# Patient Record
Sex: Male | Born: 2000
Health system: Southern US, Community
[De-identification: ages and names within clinical notes are randomized; demographics above are authoritative.]

## PROBLEM LIST (undated history)

## (undated) HISTORY — PX: MANDIBLE SURGERY: SHX707

---
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
## 2019-06-10 DIAGNOSIS — Q539 Undescended testicle, unspecified: Secondary | ICD-10-CM | POA: Diagnosis not present

## 2019-06-10 DIAGNOSIS — N50819 Testicular pain, unspecified: Secondary | ICD-10-CM | POA: Diagnosis not present

## 2019-06-10 DIAGNOSIS — I861 Scrotal varices: Secondary | ICD-10-CM | POA: Diagnosis not present

## 2019-06-10 DIAGNOSIS — N5089 Other specified disorders of the male genital organs: Secondary | ICD-10-CM | POA: Diagnosis not present

## 2019-08-17 ENCOUNTER — Other Ambulatory Visit: Payer: BLUE CROSS/BLUE SHIELD

## 2019-08-18 ENCOUNTER — Ambulatory Visit: Payer: BC Managed Care – PPO | Attending: Internal Medicine

## 2019-08-18 DIAGNOSIS — Z20822 Contact with and (suspected) exposure to covid-19: Secondary | ICD-10-CM

## 2019-08-19 LAB — NOVEL CORONAVIRUS, NAA: SARS-CoV-2, NAA: NOT DETECTED

## 2019-09-08 ENCOUNTER — Ambulatory Visit: Payer: BC Managed Care – PPO | Attending: Internal Medicine

## 2019-09-08 DIAGNOSIS — Z20822 Contact with and (suspected) exposure to covid-19: Secondary | ICD-10-CM

## 2019-09-09 LAB — NOVEL CORONAVIRUS, NAA: SARS-CoV-2, NAA: NOT DETECTED

## 2020-12-02 ENCOUNTER — Other Ambulatory Visit: Payer: Self-pay | Admitting: Family Medicine

## 2020-12-02 DIAGNOSIS — R590 Localized enlarged lymph nodes: Secondary | ICD-10-CM

## 2020-12-20 ENCOUNTER — Ambulatory Visit
Admission: RE | Admit: 2020-12-20 | Discharge: 2020-12-20 | Disposition: A | Discharge status: Home / Self Care | Payer: BC Managed Care – PPO | Source: Ambulatory Visit | Attending: Family Medicine | Admitting: Family Medicine

## 2020-12-20 DIAGNOSIS — R590 Localized enlarged lymph nodes: Secondary | ICD-10-CM
